# Patient Record
Sex: Male | Born: 1970 | Smoking: Never smoker
Health system: Southern US, Community
[De-identification: ages and names within clinical notes are randomized; demographics above are authoritative.]

---

## 2020-02-07 ENCOUNTER — Ambulatory Visit: Payer: Worker's Compensation | Admitting: Orthopaedic Surgery

## 2020-02-21 ENCOUNTER — Ambulatory Visit (INDEPENDENT_AMBULATORY_CARE_PROVIDER_SITE_OTHER): Payer: Worker's Compensation

## 2020-02-21 ENCOUNTER — Encounter: Payer: Self-pay | Admitting: Orthopaedic Surgery

## 2020-02-21 ENCOUNTER — Ambulatory Visit (INDEPENDENT_AMBULATORY_CARE_PROVIDER_SITE_OTHER): Payer: Worker's Compensation | Admitting: Orthopaedic Surgery

## 2020-02-21 VITALS — Ht 67.0 in | Wt 160.0 lb

## 2020-02-21 DIAGNOSIS — M5412 Radiculopathy, cervical region: Secondary | ICD-10-CM | POA: Diagnosis not present

## 2020-02-21 DIAGNOSIS — M7542 Impingement syndrome of left shoulder: Secondary | ICD-10-CM

## 2020-02-21 DIAGNOSIS — M25512 Pain in left shoulder: Secondary | ICD-10-CM

## 2020-02-21 DIAGNOSIS — R29898 Other symptoms and signs involving the musculoskeletal system: Secondary | ICD-10-CM

## 2020-02-21 DIAGNOSIS — M542 Cervicalgia: Secondary | ICD-10-CM

## 2020-02-21 DIAGNOSIS — G8929 Other chronic pain: Secondary | ICD-10-CM

## 2020-02-21 DIAGNOSIS — Y990 Civilian activity done for income or pay: Secondary | ICD-10-CM

## 2020-02-21 NOTE — Progress Notes (Signed)
Office Visit Note   Patient: Jeffrey Decker           Date of Birth: 1970-08-26           MRN: [id number] Visit Date: 02/21/2020              Requested by: No referring provider defined for this encounter. PCP: No primary care provider on file.   Assessment & Plan: Visit Diagnoses:  1. Neck pain   2. Chronic left shoulder pain   3. Radiculopathy, cervical region   4. Left arm weakness   5. Impingement syndrome of left shoulder   6. Work related injury     Plan: The patient's persistent symptoms since work-related injury July 17, 2019 recommend proceeding with getting MRI scans of the cervical spine and left shoulder.  He has failed conservative treatment with activity modification, medication management and also physical therapy.  Follow with Dr. Ophelia Charter after completion of his studies to discuss results and further treatment options.  With his worsening left upper extremity radiculopathy and arm weakness on exam today I recommend getting a stat cervical MRI.  Follow-Up Instructions: Return in about 2 weeks (around 03/06/2020) for With Dr. Ophelia Charter to review cervical and left shoulder MRI scans.   Orders:  Orders Placed This Encounter  Procedures  . XR Cervical Spine 2 or 3 views  . XR Shoulder Left  . MR Cervical Spine w/o contrast  . MR Shoulder Left w/o contrast   No orders of the defined types were placed in this encounter.     Procedures: No procedures performed   Clinical Data: No additional findings.   Subjective: Chief Complaint  Patient presents with  . Neck - Pain  . Left Shoulder - Pain    HPI 49 year old male comes in with complaints of neck pain and left shoulder pain which was result of a work-related injury July 17, 2019.  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  Immediately and left-sided neck pain and left  shoulder pain.  He was evaluated at [facility].  Since the injury he has been having ongoing left-sided neck pain with radiation down to the left trapezius,scapular region and lateral left shoulder.  Shoulder pain also aggravated with overhead activity and reaching on his back.  Intermittent numbness and tingling down to the left hand.  He has failed conservative treatment with medication management including but is on taper.  He has gone to formal PT x2 to 3 months without any improvement.  He has been out of work since the date of injury.  He denies any previous issues of this nature before injury.  No right-sided symptoms.  We do not have any notes or imaging studies for our review.  States that he has not had work-up with MRI scan of the shoulder or neck..    Review of Systems No current cardiac pulmonary GI GU issues  Objective: Vital Signs: Ht 5\' 7"  (1.702 m)   Wt 160 lb (72.6 kg)   BMI 25.06 kg/m   Physical Exam HENT:     Head: Normocephalic and atraumatic.  Eyes:     Extraocular Movements: Extraocular movements intact.     Pupils: Pupils are equal, round, and reactive to light.  Pulmonary:     Effort: No respiratory distress.  Abdominal:     General: There is  no distension.  Musculoskeletal:     Comments: Gait is normal.  Cervical spine he has limitation in range of motion due to pain and stiffness more on the left side.  Positive left Spurling test.  Positive left brachial plexus trapezius and scapular tenderness.  Negative on the right side.  Right shoulder unremarkable.  Left shoulder has good range of motion but with discomfort.  Negative drop arm test.  Positive impingement test.  Pain and weakness with supraspinatus resistance.  He appears to have some trace left deltoid, biceps and triceps atrophy.  Trace left biceps triceps and wrist extension weakness.  Trace left grip weakness.  Right arm strength normal.  Neurological:     Mental Status: He is alert.     Ortho  Exam  Specialty Comments:  No specialty comments available.  Imaging: XR Cervical Spine 2 or 3 views  Result Date: 02/28/2020 X-ray cervical spine shows multilevel spondylosis most noted at C3-C6 with disc space narrowing and vertebral spurs.  Straightening of the normal lordosis.  There are a couple of millimeters of retrolisthesis of C3, C4, C5.  Some calcification anterior longitudinal ligament at C4-5.   XR Shoulder Left  Result Date: 02/28/2020 X-ray left shoulder shows mild to moderate acromioclavicular degenerative changes.      PMFS History: There are no problems to display for this patient.  History reviewed. No pertinent past medical history.  History reviewed. No pertinent family history.  History reviewed. No pertinent surgical history. Social History   Occupational History  . Not on file  Tobacco Use  . Smoking status: Never Smoker  . Smokeless tobacco: Never Used  Substance and Sexual Activity  . Alcohol use: Not Currently  . Drug use: Not on file  . Sexual activity: Not on file

## 2020-02-28 ENCOUNTER — Telehealth: Payer: Self-pay | Admitting: Surgery

## 2020-02-28 ENCOUNTER — Encounter: Payer: Self-pay | Admitting: Orthopaedic Surgery

## 2020-02-28 NOTE — Telephone Encounter (Signed)
02/21/2020 ov note faxed to Tacey Ruiz, nuse case mgr w/ [facility] [employer] fax [phone], ph [phone]

## 2020-03-01 NOTE — Telephone Encounter (Signed)
Case Manager is requesting work note after visit on 02/21/2020.  Could you please advise on patient's work status? You have ordered 2 MRI's which we have to get changed to Dr. Ophelia Charter signature per W/C. I can get those changed. I was not sure if patient is out of work pending MRI review. Thanks.

## 2020-03-02 ENCOUNTER — Other Ambulatory Visit: Payer: Self-pay | Admitting: Orthopaedic Surgery

## 2020-03-02 DIAGNOSIS — G8929 Other chronic pain: Secondary | ICD-10-CM

## 2020-03-02 DIAGNOSIS — R29898 Other symptoms and signs involving the musculoskeletal system: Secondary | ICD-10-CM

## 2020-03-02 DIAGNOSIS — M7542 Impingement syndrome of left shoulder: Secondary | ICD-10-CM

## 2020-03-02 DIAGNOSIS — M5412 Radiculopathy, cervical region: Secondary | ICD-10-CM

## 2020-03-02 DIAGNOSIS — M542 Cervicalgia: Secondary | ICD-10-CM

## 2020-03-02 DIAGNOSIS — Y990 Civilian activity done for income or pay: Secondary | ICD-10-CM

## 2020-03-05 ENCOUNTER — Telehealth: Payer: Self-pay

## 2020-03-05 NOTE — Telephone Encounter (Signed)
Were you able to speak to her? Dr. Ophelia Charter recommended 1.5T MRI and says that the open MRI images will not be good. He will be glad to prescribe valium for patient to take prior. Please let me know if I need to call her. Thanks.

## 2020-03-05 NOTE — Telephone Encounter (Signed)
Tacey Ruiz from [facility] [employer] called   Lupita Leash stated  Patient was recommended to get a open MRI due to being claustrophobic  Call back:[phone]

## 2020-03-07 ENCOUNTER — Telehealth: Payer: Self-pay | Admitting: Radiology

## 2020-03-07 NOTE — Telephone Encounter (Signed)
Error

## 2020-03-07 NOTE — Telephone Encounter (Signed)
If still needs note. OOW until rov with Dr Ophelia Charter to review studies.   Thanks

## 2020-03-07 NOTE — Telephone Encounter (Signed)
Please see note from [place]. Will it be ok for me to enter this note from him? I know that they were requesting MRI orders had to be signed by Dr. Ophelia Charter.  Will work note need to come from him as well?

## 2020-03-07 NOTE — Telephone Encounter (Signed)
FYI   I received call from  Tana Coast, RN, CCM  Telephonic Case Manager  [facility] [employer]  Phone: [phone] or [phone]  Fax: [phone]  E-mail: [email or web address]   Requesting approval for MRI to be done in 0.70T magnet due to severe claustrophobia of patient. Advised per Dr Ophelia Charter that he did not want MRI to be done in magnet less than 1.5T due to image quality. I explained that patient could be prescribed valium prior to procedure if needed.  Lupita Leash called back and asked if I could call patient to explain why he cannot have 0.70T magnet scan.  I called patient who states that he thinks that he will be fine in scanner. He says that he told someone he may have a problem if he was in there for an hour of so, but he should be able to handle it for tests.  I explained that this would be a lengthy amount of time due to double study.  He does not want to take anything and feels that he will be ok. I did explain that [facility] has a more open scanner and that I have given Lupita Leash that info in case he can get it scheduled there. Patient would like for me to express to Lupita Leash that he would like to have scan after 3pm if possible due to his wife and her work.  Patient has appt for review scheduled 03/13/2020 and will call to reschedule if MRI cannot be done prior to that.   I left voicemail for Lupita Leash advising all of the above.

## 2020-03-13 ENCOUNTER — Ambulatory Visit: Payer: Medicaid Other | Admitting: Orthopaedic Surgery

## 2020-03-18 ENCOUNTER — Other Ambulatory Visit: Payer: Medicaid Other

## 2020-04-01 ENCOUNTER — Ambulatory Visit
Admission: RE | Admit: 2020-04-01 | Discharge: 2020-04-01 | Disposition: A | Discharge status: Home / Self Care | Payer: Worker's Compensation | Source: Ambulatory Visit | Attending: Orthopaedic Surgery | Admitting: Orthopaedic Surgery

## 2020-04-01 DIAGNOSIS — M7542 Impingement syndrome of left shoulder: Secondary | ICD-10-CM

## 2020-04-01 DIAGNOSIS — M5412 Radiculopathy, cervical region: Secondary | ICD-10-CM

## 2020-04-01 DIAGNOSIS — Y990 Civilian activity done for income or pay: Secondary | ICD-10-CM

## 2020-04-01 DIAGNOSIS — M542 Cervicalgia: Secondary | ICD-10-CM

## 2020-04-01 DIAGNOSIS — M25512 Pain in left shoulder: Secondary | ICD-10-CM

## 2020-04-01 DIAGNOSIS — R29898 Other symptoms and signs involving the musculoskeletal system: Secondary | ICD-10-CM

## 2020-04-03 ENCOUNTER — Ambulatory Visit: Payer: Medicaid Other | Admitting: Orthopaedic Surgery

## 2020-04-09 NOTE — Progress Notes (Signed)
Scheduled 10/19

## 2020-04-24 ENCOUNTER — Inpatient Hospital Stay: Admission: RE | Admit: 2020-04-24 | Payer: Medicaid Other | Source: Ambulatory Visit

## 2020-04-24 ENCOUNTER — Other Ambulatory Visit: Payer: Medicaid Other

## 2020-05-13 ENCOUNTER — Inpatient Hospital Stay: Admission: RE | Admit: 2020-05-13 | Payer: Medicaid Other | Source: Ambulatory Visit

## 2020-05-13 ENCOUNTER — Other Ambulatory Visit: Payer: Medicaid Other

## 2020-05-14 ENCOUNTER — Telehealth: Payer: Self-pay | Admitting: Orthopaedic Surgery

## 2020-05-14 NOTE — Telephone Encounter (Signed)
Lupita Leash, CS Manger, called. She would like Betsy to call her. [phone]

## 2020-05-14 NOTE — Telephone Encounter (Signed)
This is the message that I got from the case manager. Do you mind calling to see what she needs?

## 2020-05-14 NOTE — Telephone Encounter (Signed)
I called case manager Lupita Leash and left message. Thx Tisha

## 2020-05-14 NOTE — Telephone Encounter (Signed)
noted 

## 2020-05-15 ENCOUNTER — Ambulatory Visit: Payer: Medicaid Other | Admitting: Orthopaedic Surgery

## 2020-06-05 ENCOUNTER — Telehealth: Payer: Self-pay

## 2020-06-05 NOTE — Telephone Encounter (Signed)
Yes Ok thank you 

## 2020-06-05 NOTE — Telephone Encounter (Signed)
I left voicemail for patient requesting return call to let me know which pharmacy he would like medication to be sent to. Advised we would send Valium in and gave directions on how he would take it. Will call in once pharmacy is documented.

## 2020-06-05 NOTE — Telephone Encounter (Signed)
Patient called he wants to schedule a sedative so he can get both of his MRI's completed. [phone]

## 2020-06-05 NOTE — Telephone Encounter (Signed)
Please advise. OK for valium?

## 2020-06-11 MED ORDER — DIAZEPAM 5 MG PO TABS: ORAL_TABLET | ORAL | 0 refills | Status: AC

## 2020-06-11 NOTE — Telephone Encounter (Signed)
Called to pharmacy.  I called and spoke with patient and advised. We went over directions for taking valium.

## 2020-06-11 NOTE — Telephone Encounter (Signed)
Pt called and advised that he would like the medication to be sent to  [facility] on [address] in [place] He would also like a call back when it has been sent in.

## 2020-06-11 NOTE — Telephone Encounter (Signed)
FYI   I called and left another voicemail for patient requesting return call to advise which pharmacy he would like for me to call medication to.  I have also sent patient an email. As of now, I have not received response back from him either way.

## 2020-06-11 NOTE — Addendum Note (Signed)
Addended by: Rogers Seeds on: 06/11/2020 01:09 PM   Modules accepted: Orders

## 2020-07-05 ENCOUNTER — Ambulatory Visit
Admission: RE | Admit: 2020-07-05 | Discharge: 2020-07-05 | Disposition: A | Discharge status: Home / Self Care | Payer: Worker's Compensation | Source: Ambulatory Visit | Attending: Orthopaedic Surgery | Admitting: Orthopaedic Surgery

## 2020-07-05 DIAGNOSIS — Y990 Civilian activity done for income or pay: Secondary | ICD-10-CM

## 2020-07-05 DIAGNOSIS — M7542 Impingement syndrome of left shoulder: Secondary | ICD-10-CM

## 2020-07-05 DIAGNOSIS — G8929 Other chronic pain: Secondary | ICD-10-CM

## 2020-07-05 DIAGNOSIS — M542 Cervicalgia: Secondary | ICD-10-CM

## 2020-07-05 DIAGNOSIS — M5412 Radiculopathy, cervical region: Secondary | ICD-10-CM

## 2020-07-05 DIAGNOSIS — R29898 Other symptoms and signs involving the musculoskeletal system: Secondary | ICD-10-CM

## 2020-07-13 ENCOUNTER — Ambulatory Visit (INDEPENDENT_AMBULATORY_CARE_PROVIDER_SITE_OTHER): Payer: Worker's Compensation | Admitting: Orthopaedic Surgery

## 2020-07-13 ENCOUNTER — Encounter: Payer: Self-pay | Admitting: Orthopaedic Surgery

## 2020-07-13 VITALS — Ht 67.0 in | Wt 150.0 lb

## 2020-07-13 DIAGNOSIS — M25512 Pain in left shoulder: Secondary | ICD-10-CM | POA: Insufficient documentation

## 2020-07-13 DIAGNOSIS — M542 Cervicalgia: Secondary | ICD-10-CM

## 2020-07-13 DIAGNOSIS — M47812 Spondylosis without myelopathy or radiculopathy, cervical region: Secondary | ICD-10-CM | POA: Diagnosis not present

## 2020-07-13 DIAGNOSIS — G8929 Other chronic pain: Secondary | ICD-10-CM

## 2020-07-13 NOTE — Progress Notes (Signed)
Office Visit Note   Patient: Jeffrey Decker           Date of Birth: [birth year]           MRN: [id number] Visit Date: 07/13/2020              Requested by: No referring provider defined for this encounter. PCP: Patient, No Pcp Per   Assessment & Plan: Visit Diagnoses:  1. Neck pain   2. Chronic left shoulder pain   3. Spondylosis without myelopathy or radiculopathy, cervical region   4. Left shoulder pain, unspecified chronicity     Plan: Patient's shoulder MRI does show some mild to moderate tendinopathy without evidence of rotator cuff tearing.  Normal healthy muscle.  Cervical MRI and plain radiographs demonstrate cervical spondylosis at C3-4, C4-5 also C5-6 and C6-7.  Patient does have some foraminal narrowing  on the right side opposite from his left shoulder symptoms.  Patient asked about prescribing Imitrex and I discussed him this is not a medicine I am familiar with and he can either speak with his PCP or talk with his attorney about possible neurology referral etc.  Patient asked about being referred for physical therapy.  He has been through physical therapy in the past and I would recommend proceeding with the FCE to determine his work capacity.  Office follow-up after FCE and a work note was given to him that states patient was seen today in the office and an FCE has been ordered.  I plan to reevaluate him after FCE.  Follow-Up Instructions: No follow-ups on file.   Orders:  Orders Placed This Encounter  Procedures  . Ambulatory referral to Physical Therapy   No orders of the defined types were placed in this encounter.     Procedures: No procedures performed   Clinical Data: No additional findings.   Subjective: Chief Complaint  Patient presents with  . Neck - Pain, Follow-up    MRI review  . Left Shoulder - Follow-up, Pain    MRI review.    HPI 50 year old male seen after MRI scan performed left shoulder and left neck.  Patient saw my PA Zonia Kief, PA-C on 02/21/2020 for an on-the-job injury that occurred 1 year ago on July 17, 2019.  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  He states he been there for 4 to 6 months and had an injury that occurred when he was loading a trailer and a skid box of frozen meat fell and hit him on the top of the head and left side neck and left shoulder.  Today patient has on hat with dark glasses states he is having problems with migraines which began second week of February.  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  He had taken one of his wife's sumatriptan tablets which seemed to help.  Patient's imaging studies were done 07/05/2020 and reviewed with patient today.  Patient states he cannot get his arm up above shoulder level on the left.  No problems with the right.  He states he has pain in his neck that radiates into his shoulder.  Patient's had physical therapy for 2 to 3 months in the past without improvement per Zonia Kief, PA-C 02/21/2020 office note.     Review of Systems all other systems updated unchanged from 02/21/2020 office visit.  No chills or fever.  No gait disturbance.  No myelopathic symptoms.   Objective: Vital Signs: Ht 5\' 7"  (1.702 m)  Wt 150 lb (68 kg)   BMI 23.49 kg/m   Physical Exam Constitutional:      Appearance: He is well-developed and well-nourished.  HENT:     Head: Normocephalic and atraumatic.  Eyes:     Extraocular Movements: EOM normal.     Pupils: Pupils are equal, round, and reactive to light.  Neck:     Thyroid: No thyromegaly.     Trachea: No tracheal deviation.  Cardiovascular:     Rate and Rhythm: Normal rate.  Pulmonary:     Effort: Pulmonary effort is normal.     Breath sounds: No wheezing.  Abdominal:     General: Bowel sounds are normal.     Palpations: Abdomen is soft.  Skin:    General: Skin is warm and dry.     Capillary Refill: Capillary refill takes less than 2 seconds.  Neurological:     Mental  Status: He is alert and oriented to person, place, and time.  Psychiatric:        Mood and Affect: Mood and affect normal.        Behavior: Behavior normal.        Thought Content: Thought content normal.        Judgment: Judgment normal.     Ortho Exam patient is wearing hat and dark glasses due to migraine.  With internal rotation of the shoulder at 90 degrees he complains of pain.  With movement to flexion with his left shoulder he rapidly shifts his shoulder forward and complains of pain.  Long head of the biceps is nontender.  Patient has positive pinch test.  Specialty Comments:  No specialty comments available.  Imaging: CLINICAL DATA:  Shoulder trauma rotator cuff tear  EXAM: MRI OF THE LEFT SHOULDER WITHOUT CONTRAST  TECHNIQUE: Multiplanar, multisequence MR imaging of the shoulder was performed. No intravenous contrast was administered.  COMPARISON:  None.  FINDINGS: Rotator cuff: Bursal surface fraying seen at the anterior supraspinatus insertion site. Increased intrasubstance signal seen within the supraspinatus, subscapularis, and infraspinatus tendons. The muscles of the rotator cuff are normal without tear, edema, or atrophy.  Muscles: The muscles other than the rotator cuff are normal without tear, edema, or atrophy.  Biceps Long Head: The Intraarticular and extraarticular portions of the biceps tendon are normal in position, size and signal.  Acromioclavicular Joint: Moderate to advanced AC joint arthrosis seen with joint space loss and capsular hypertrophy. Type II acromion.  Glenohumeral Joint: The glenohumeral joint alignment is well maintained. Mild glenohumeral joint chondral disease is seen with chondral thinning. A trace glenohumeral joint effusion is seen.  Labrum: A nondisplaced tear is seen at the anterior superior labrum which does not extend posteriorly. No paralabral cyst is seen.  Bones: No fracture, osteonecrosis, or  pathologic marrow infiltration.  Other: A small amount of subacromial-subdeltoid bursal fluid is noted.  IMPRESSION: Bursal surface fraying of the anterior supraspinatus tendon. No rotator cuff tears.  Mild-to-moderate supraspinatus, infraspinatus, subscapularis tendinosis  Mild glenohumeral joint chondral disease with a nondisplaced anterior superior labral tear.  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   Electronically Signed   By: Prudencio Pair M.D.   On: 07/05/2020 19:02 CLINICAL DATA:  Neck trauma, dangerous injury mechanism. Worsening neck pain and left upper extremity radiculopathy, weakness.  EXAM: MRI CERVICAL SPINE WITHOUT CONTRAST  TECHNIQUE: Multiplanar, multisequence MR imaging of the cervical spine was performed. No intravenous contrast was administered.  COMPARISON:  Plain films February 21, 2020.  FINDINGS: Alignment: Mild reversal of the cervical curvature.  Vertebrae: No fracture, evidence of discitis, or bone lesion. Marrow edema at C5 and C6 likely degenerative.  Cord: Normal signal and morphology.  Posterior Fossa, vertebral arteries, paraspinal tissues: Negative.  Disc levels:  C2-3: No spinal canal or neural foraminal stenosis.  C3-4: Small posterior disc protrusion without significant spinal canal stenosis. Uncovertebral and facet degenerative changes resulting in mild right neural foraminal narrowing.  C4-5: Posterior disc protrusion without significant spinal canal stenosis. Uncovertebral and facet degenerative changes resulting in moderate right neural foraminal narrowing.  C5-6: Posterior disc extrusion migrating inferiorly and causing indentation of the thecal sac without significant spinal canal stenosis. Uncovertebral and facet degenerative changes resulting in mild-to-moderate right neural foraminal narrowing.  C6-7: Posterior disc protrusion causing indentation of  the thecal sac without significant spinal canal stenosis. Uncovertebral and facet degenerative changes resulting mild left neural foraminal narrowing.  C7-T1: No spinal canal or neural foraminal stenosis.  IMPRESSION: 1. No acute fracture of the cervical spine. In case of high clinical suspicion, correlation with CT of the cervical spine is suggested. 2. Mild multilevel degenerative changes of the cervical spine with inferiorly migrating central disc extrusion at C5-6 causing indentation of the thecal sac without significant spinal canal stenosis. 3. Moderate right neural foraminal narrowing at C4-5. Mild-to-moderate right neural foraminal narrowing at C5-6.   Electronically Signed   By: Baldemar Lenis M.D.   On: 07/05/2020 17:55  PMFS History: Patient Active Problem List   Diagnosis Date Noted  . Spondylosis without myelopathy or radiculopathy, cervical region 07/13/2020   No past medical history on file.  No family history on file.  No past surgical history on file. Social History   Occupational History  . Not on file  Tobacco Use  . Smoking status: Never Smoker  . Smokeless tobacco: Never Used  Substance and Sexual Activity  . Alcohol use: Not Currently  . Drug use: Not on file  . Sexual activity: Not on file

## 2020-07-16 ENCOUNTER — Telehealth: Payer: Self-pay

## 2020-07-16 NOTE — Telephone Encounter (Signed)
Could you please send note to case manager?

## 2020-07-16 NOTE — Telephone Encounter (Signed)
Jeffrey Decker from [facility] case management called she is requesting office notes from Fridays visit. Fax:[phone] CB:[phone]

## 2020-08-21 ENCOUNTER — Ambulatory Visit: Payer: Medicaid Other | Admitting: Orthopaedic Surgery

## 2020-09-04 ENCOUNTER — Ambulatory Visit: Payer: Medicaid Other | Admitting: Orthopaedic Surgery

## 2021-10-03 IMAGING — MR MR CERVICAL SPINE W/O CM
4 of 5 series · 27 of 48 positions shown · non-contrast
Comparison: Plain films February 21, 2020.

CLINICAL DATA: Neck trauma, dangerous injury mechanism. Worsening
neck pain and left upper extremity radiculopathy, weakness.

EXAM:
MRI CERVICAL SPINE WITHOUT CONTRAST
TECHNIQUE: Multiplanar, multisequence MR imaging of the cervical spine was
performed. No intravenous contrast was administered.

[Series 5: T2 · sagittal · 3.0mm · 0.55mm/px · 6 of 13 slices shown (1 of 2)]
[im 1/13]
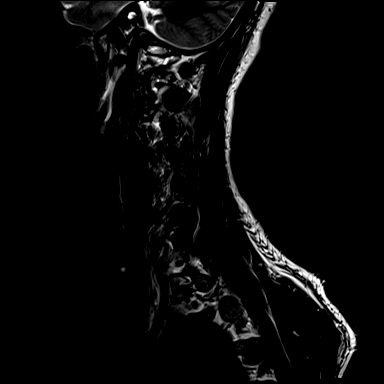
[im 3/13]
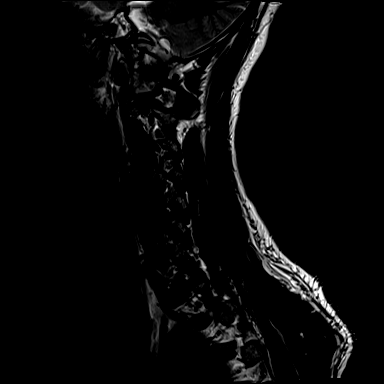
[im 5/13]
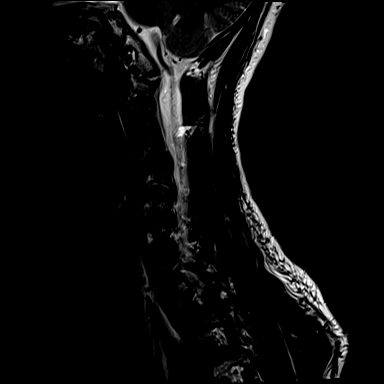
[im 8/13]
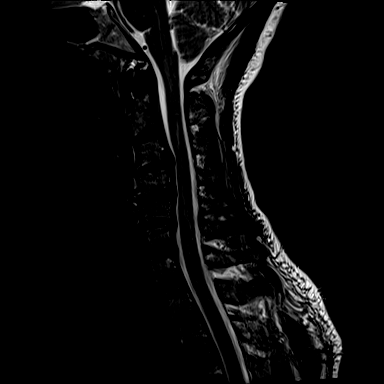
[im 10/13]
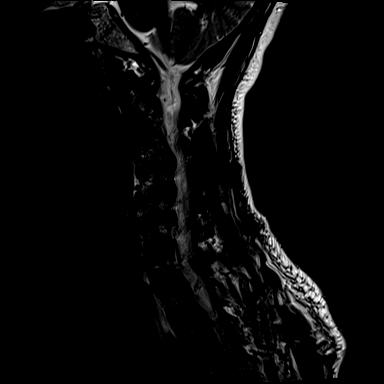
[im 13/13]
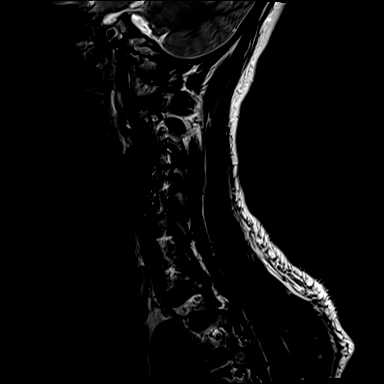

[Series 7: T1 · sagittal · 3.0mm · 0.66mm/px · 7 of 13 slices shown]
[im 1/13]
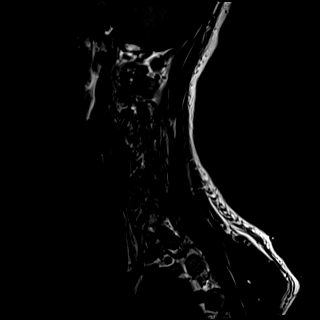
[im 3/13]
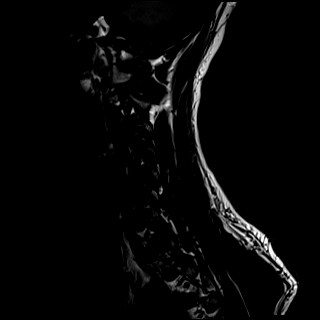
[im 5/13]
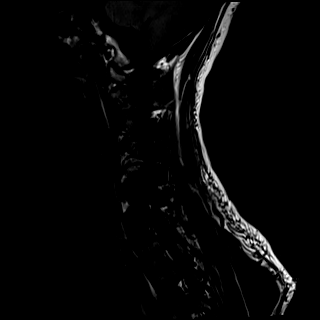
[im 7/13]
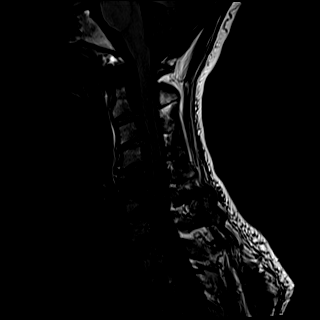
[im 9/13]
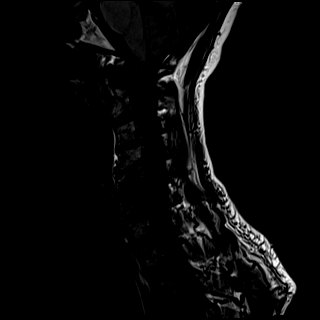
[im 11/13]
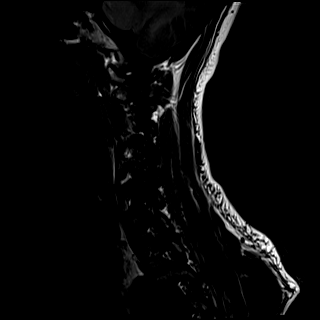
[im 13/13]
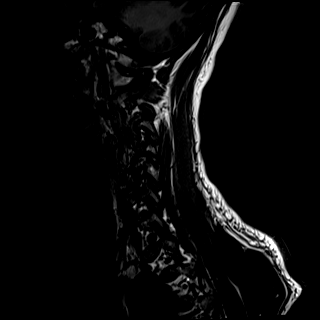

[Series 8: STIR · sagittal · 3.0mm · 0.33mm/px · 6 of 13 slices shown]
[im 1/13]
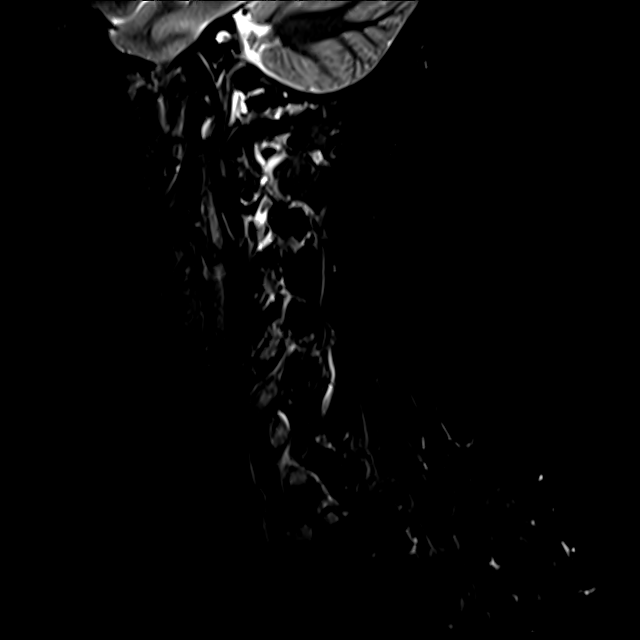
[im 3/13]
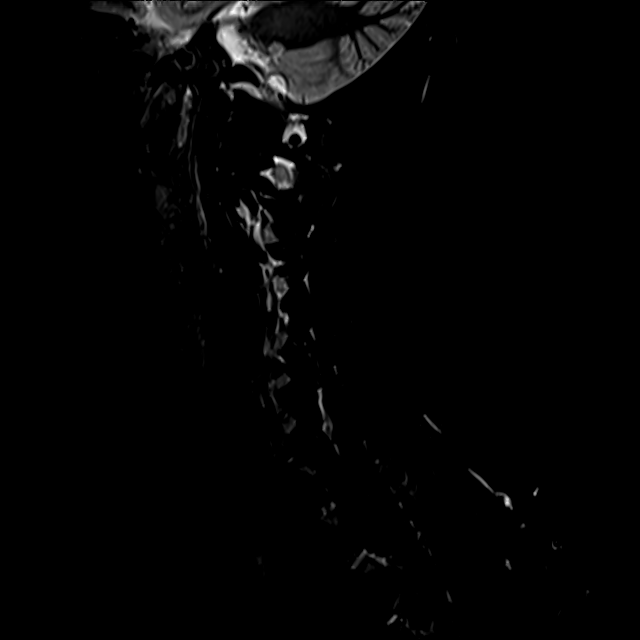
[im 5/13]
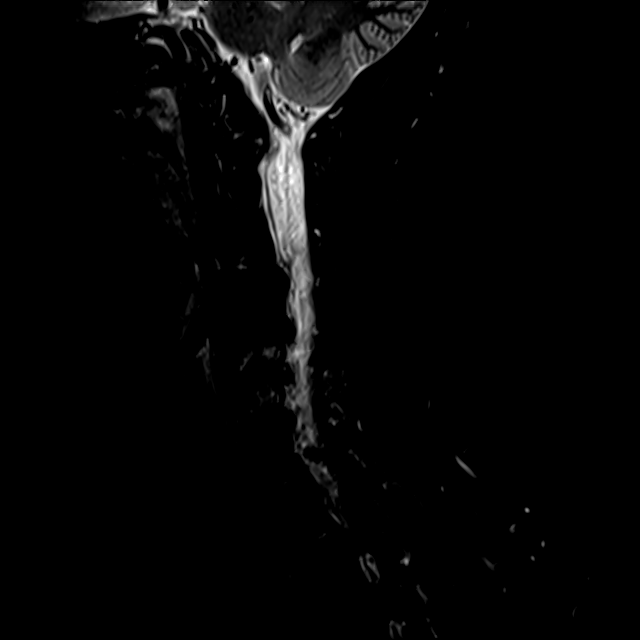
[im 7/13]
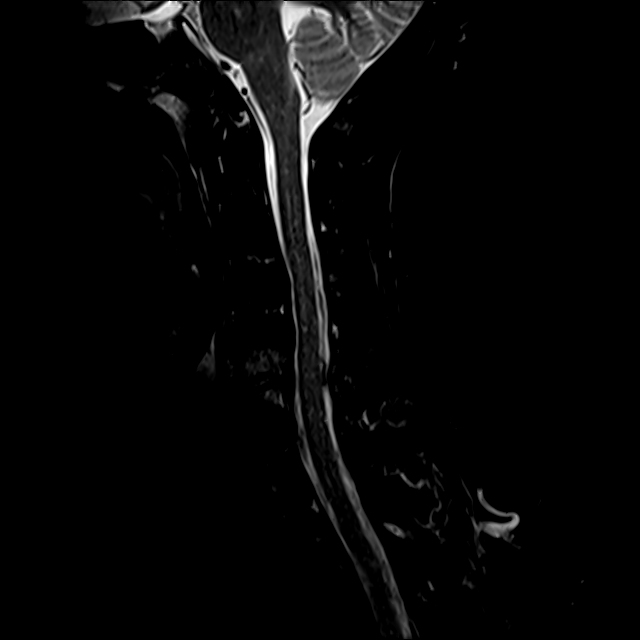
[im 9/13]
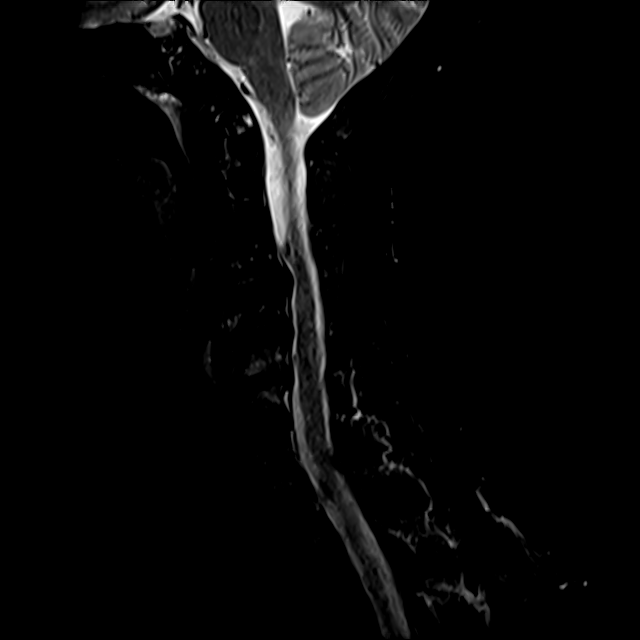
[im 11/13]
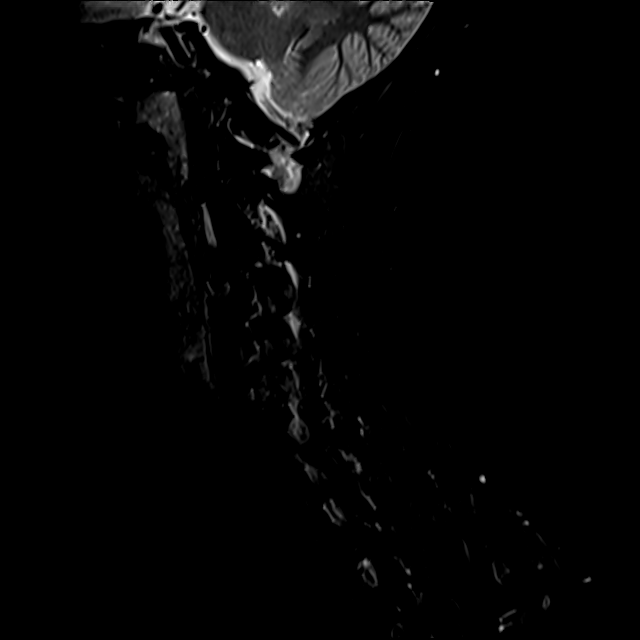

[Series 9: T2 · axial · 3.0mm · 0.50mm/px · z∈[-39,+64]mm · 8 of 28 slices shown (2 of 2)]
[im 1/28]
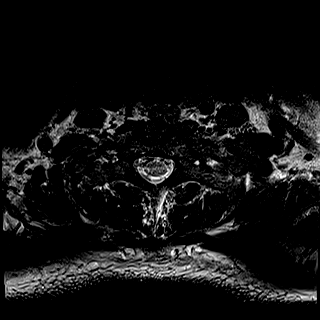
[im 5/28]
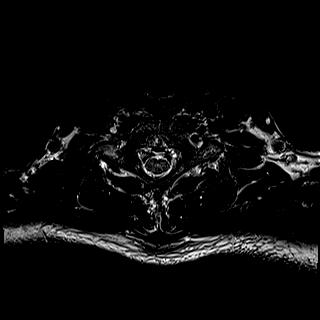
[im 9/28]
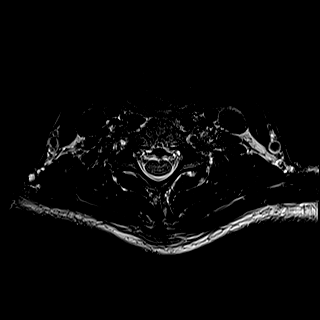
[im 13/28]
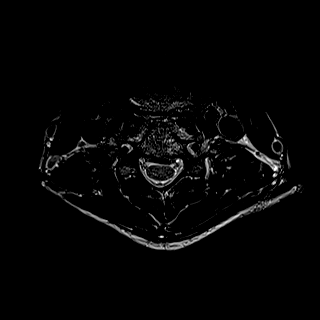
[im 15/28]
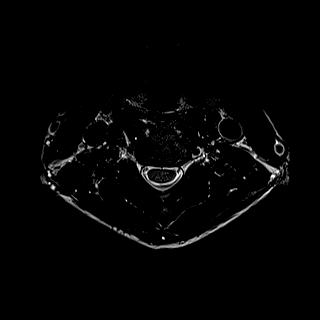
[im 19/28]
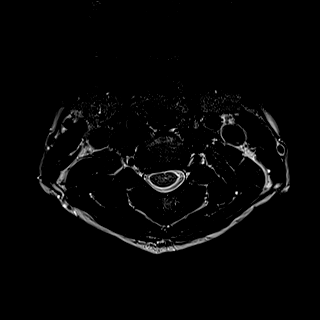
[im 23/28]
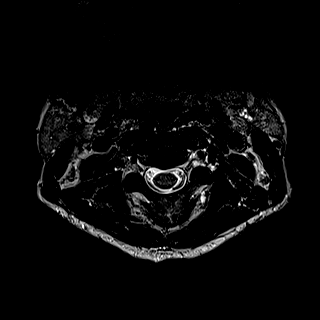
[im 28/28]
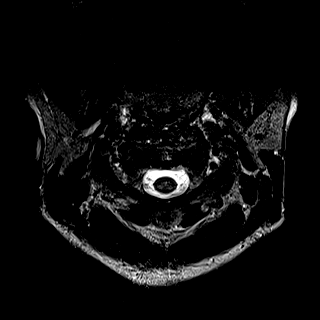

[27 of 48 positions shown; findings below may reference images not displayed]

FINDINGS: Alignment: Mild reversal of the cervical curvature.

Vertebrae: No fracture, evidence of discitis, or bone lesion. Marrow
edema at C5 and C6 likely degenerative.

Cord: Normal signal and morphology.

Posterior Fossa, vertebral arteries, paraspinal tissues: Negative.

Disc levels:

C2-3: No spinal canal or neural foraminal stenosis.

C3-4: Small posterior disc protrusion without significant spinal
canal stenosis. Uncovertebral and facet degenerative changes
resulting in mild right neural foraminal narrowing.

C4-5: Posterior disc protrusion without significant spinal canal
stenosis. Uncovertebral and facet degenerative changes resulting in
moderate right neural foraminal narrowing.

C5-6: Posterior disc extrusion migrating inferiorly and causing
indentation of the thecal sac without significant spinal canal
stenosis. Uncovertebral and facet degenerative changes resulting in
mild-to-moderate right neural foraminal narrowing.

C6-7: Posterior disc protrusion causing indentation of the thecal
sac without significant spinal canal stenosis. Uncovertebral and
facet degenerative changes resulting mild left neural foraminal
narrowing.

C7-T1: No spinal canal or neural foraminal stenosis.
IMPRESSION: 1. No acute fracture of the cervical spine. In case of high clinical
suspicion, correlation with CT of the cervical spine is suggested.
2. Mild multilevel degenerative changes of the cervical spine with
inferiorly migrating central disc extrusion at C5-6 causing
indentation of the thecal sac without significant spinal canal
stenosis.
3. Moderate right neural foraminal narrowing at C4-5.
Mild-to-moderate right neural foraminal narrowing at C5-6.
